# Patient Record
Sex: Male | Born: 1980 | Race: White | Hispanic: Yes | Marital: Single | Smoking: Never smoker
Health system: Southern US, Community
[De-identification: ages and names within clinical notes are randomized; demographics above are authoritative.]

---

## 2004-03-18 ENCOUNTER — Ambulatory Visit (HOSPITAL_COMMUNITY): Admission: RE | Admit: 2004-03-18 | Discharge: 2004-03-18 | Payer: Self-pay | Admitting: Chiropractor

## 2008-10-08 ENCOUNTER — Emergency Department (HOSPITAL_COMMUNITY): Admission: EM | Admit: 2008-10-08 | Discharge: 2008-10-08 | Payer: Self-pay | Admitting: Emergency Medicine

## 2010-05-21 LAB — COMPREHENSIVE METABOLIC PANEL
AST: 35 U/L (ref 0–37)
Albumin: 4.5 g/dL (ref 3.5–5.2)
Alkaline Phosphatase: 98 U/L (ref 39–117)
Calcium: 9.7 mg/dL (ref 8.4–10.5)
Chloride: 102 mEq/L (ref 96–112)
Creatinine, Ser: 0.88 mg/dL (ref 0.4–1.5)
Glucose, Bld: 135 mg/dL — ABNORMAL HIGH (ref 70–99)
Potassium: 4.1 mEq/L (ref 3.5–5.1)
Total Protein: 8.1 g/dL (ref 6.0–8.3)

## 2010-05-21 LAB — POCT I-STAT, CHEM 8
BUN: 12 mg/dL (ref 6–23)
Calcium, Ion: 1.07 mmol/L — ABNORMAL LOW (ref 1.12–1.32)
Chloride: 103 mEq/L (ref 96–112)
Creatinine, Ser: 0.9 mg/dL (ref 0.4–1.5)
Glucose, Bld: 136 mg/dL — ABNORMAL HIGH (ref 70–99)
HCT: 48 % (ref 39.0–52.0)
Potassium: 4 mEq/L (ref 3.5–5.1)
Sodium: 139 mEq/L (ref 135–145)

## 2010-05-21 LAB — POCT CARDIAC MARKERS
CKMB, poc: 1.2 ng/mL (ref 1.0–8.0)
Troponin i, poc: <0.05 ng/mL (ref 0.00–0.09)

## 2011-01-21 IMAGING — CT CT PELVIS W/ CM
2 of 4 series · 17 of 46 positions shown, 19 images · IV contrast (APPLIED)
Comparison: Abdomen ultrasound obtained earlier today.

CT ABDOMEN

CLINICAL DATA: Diffuse abdominal pain.  Known cholelithiasis.

CT ABDOMEN AND PELVIS WITH CONTRAST
TECHNIQUE: Multidetector CT imaging of the abdomen and pelvis was
performed using the standard protocol following bolus
administration of intravenous contrast.
Contrast: 100 ml Lmnipaque-R33

[Series 3: abd/pelv with 5.0 b31f st · axial · 0.87mm/px · z∈[-519,-49]mm · 14 of 104 slices shown, 16 images]
[im 5/104  soft-tissue]
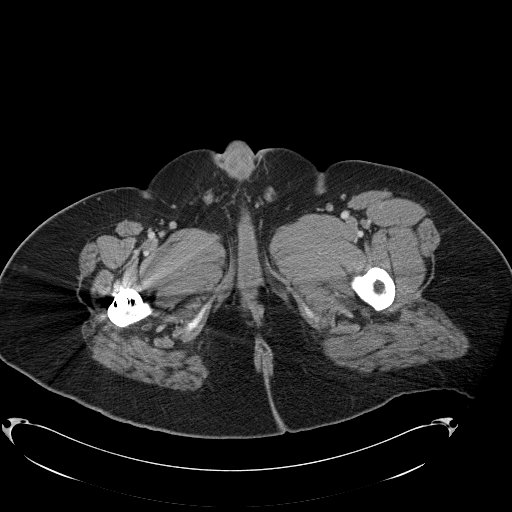
[im 5/104  bone]
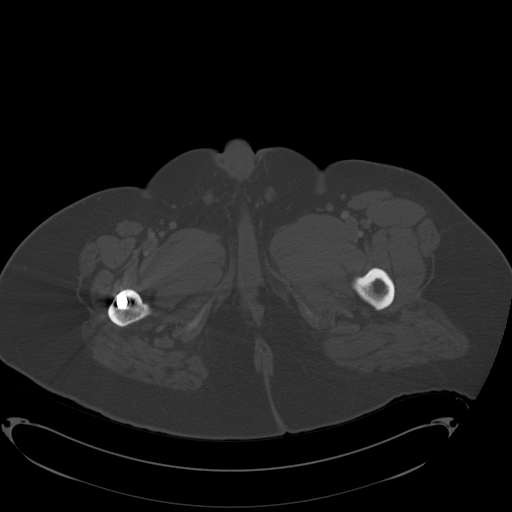
[im 13/104  soft-tissue]
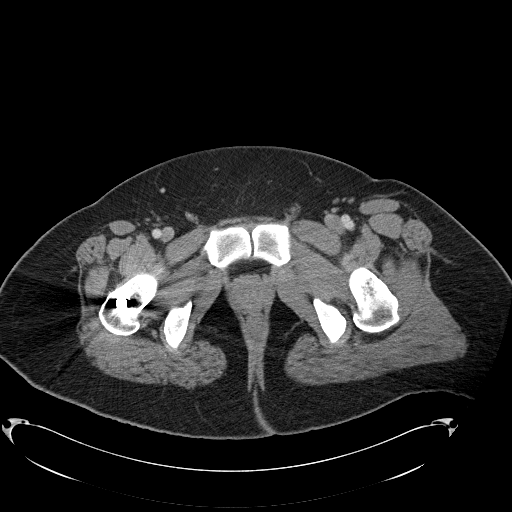
[im 21/104  soft-tissue]
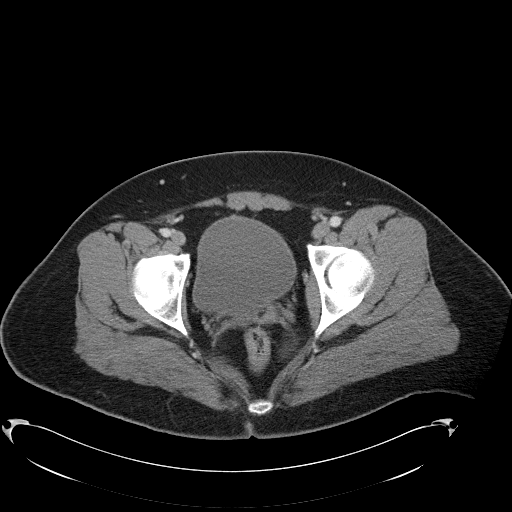
[im 29/104  soft-tissue]
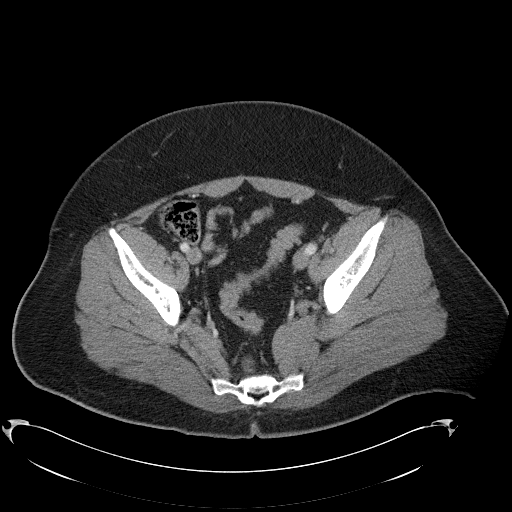
[im 33/104  soft-tissue]
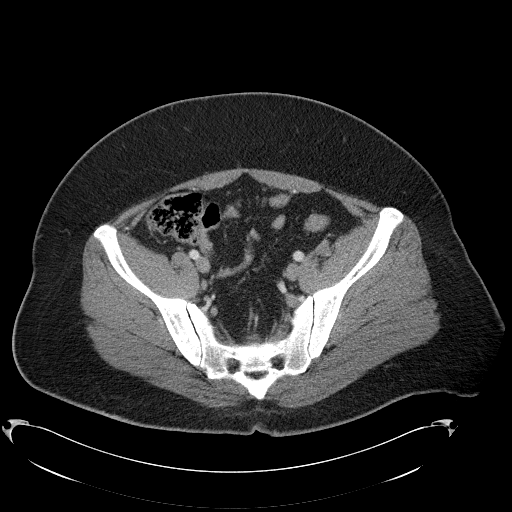
[im 42/104  soft-tissue]
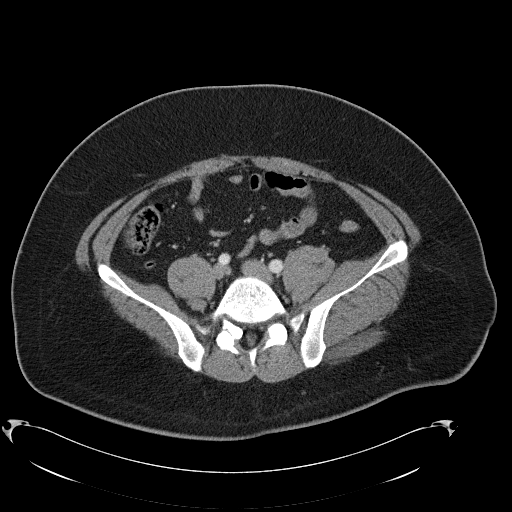
[im 50/104  soft-tissue]
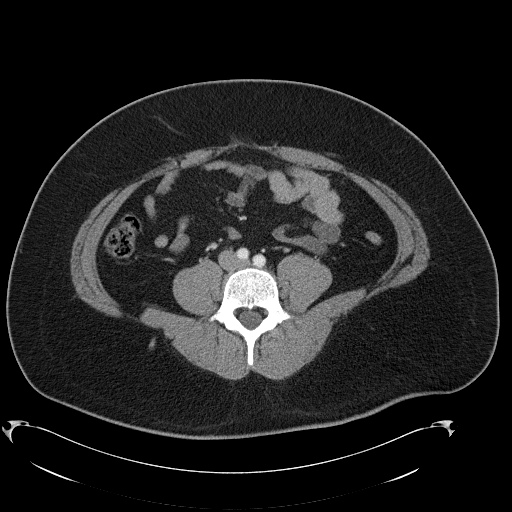
[im 54/104  soft-tissue]
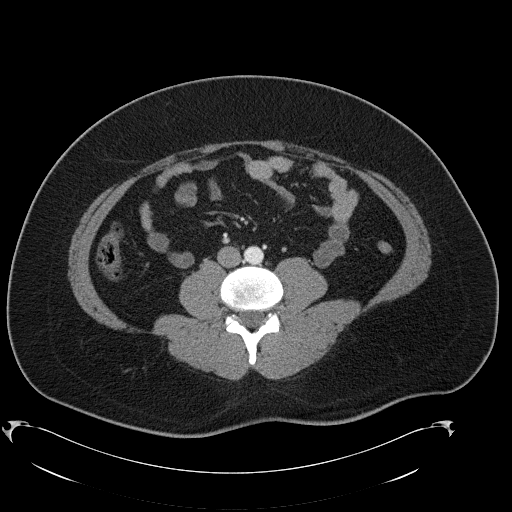
[im 62/104  soft-tissue]
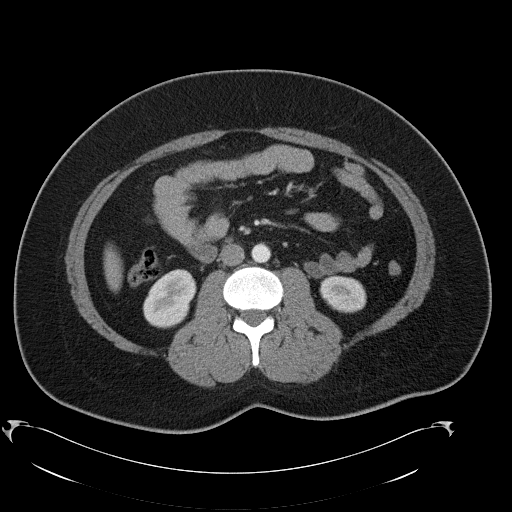
[im 62/104  bone]
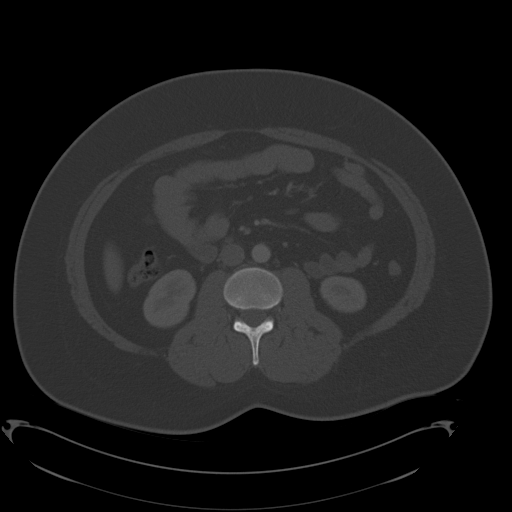
[im 71/104  soft-tissue]
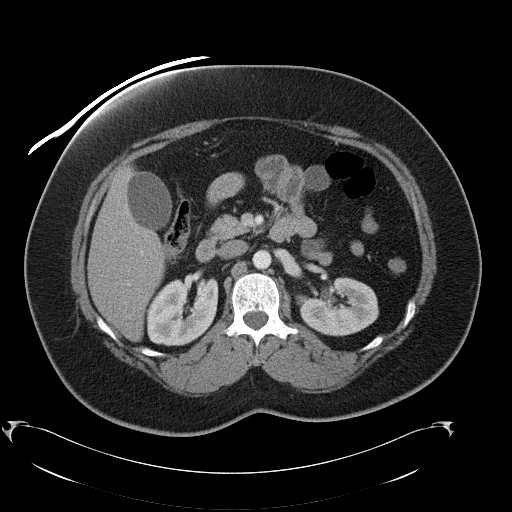
[im 79/104  soft-tissue]
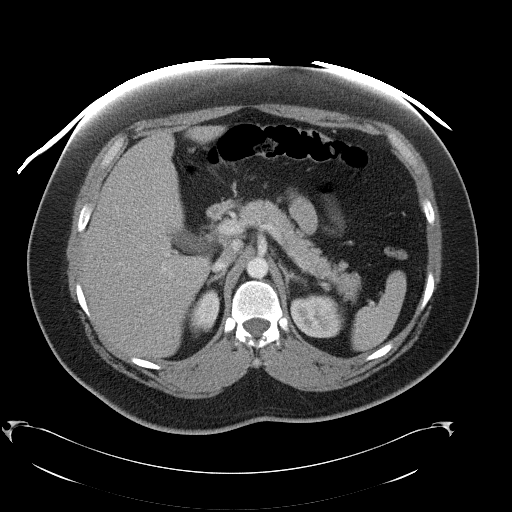
[im 83/104  soft-tissue]
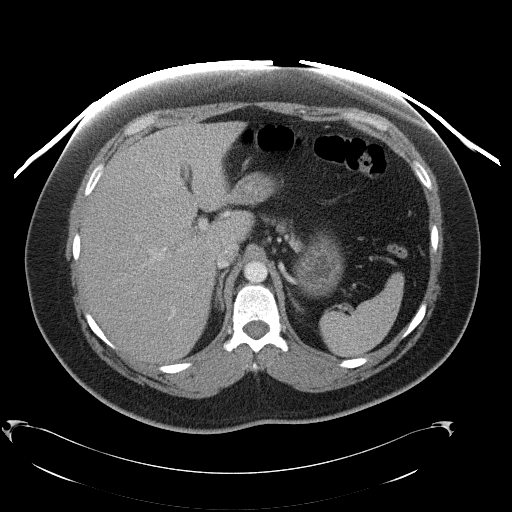
[im 91/104  soft-tissue]
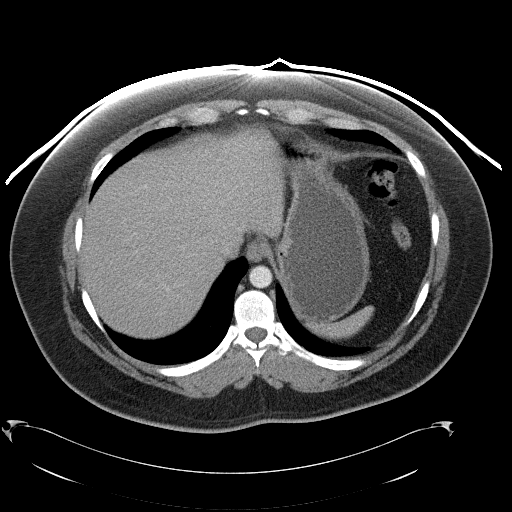
[im 99/104  soft-tissue]
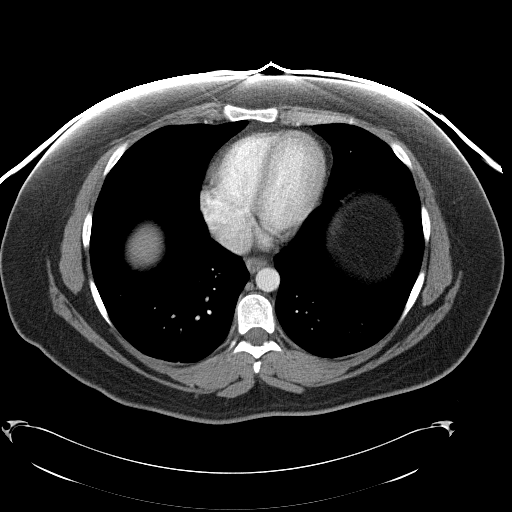

[Series 5: abd/pelv with 3.0 spo cor st · coronal · 1.01mm/px · 3 of 75 slices shown]
[im 25/75  soft-tissue]
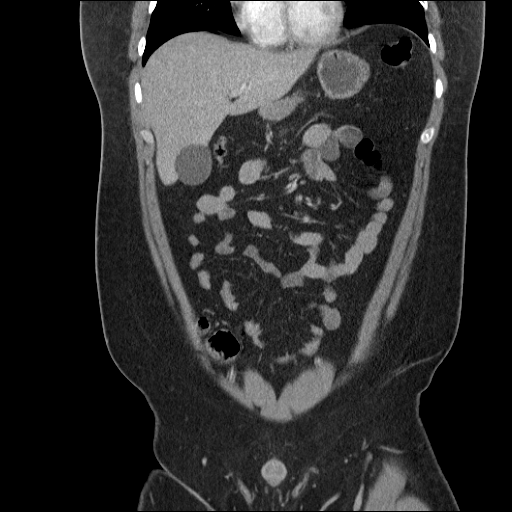
[im 33/75  soft-tissue]
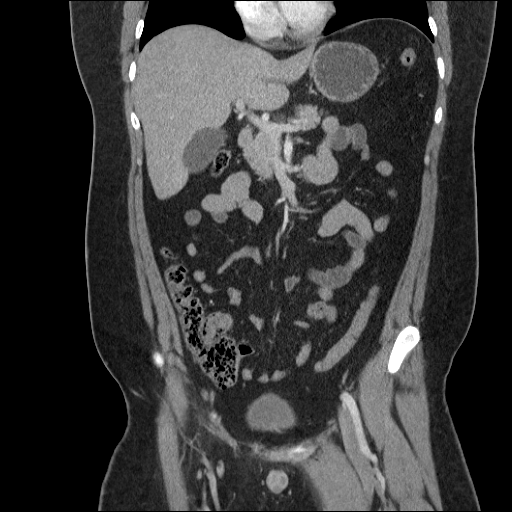
[im 42/75  soft-tissue]
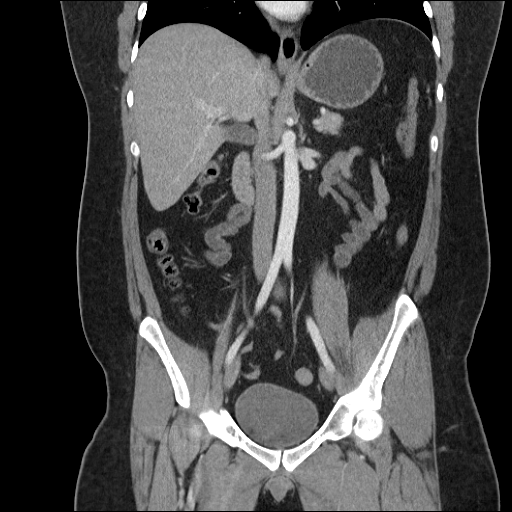

[17 of 46 positions shown; findings below may reference images not displayed]

FINDINGS: Normal sized heart.  Clear lung bases.  Normal appearing
liver, spleen, pancreas, gallbladder and adrenal glands.  Multiple
small punctate areas of increased density in both kidneys.  It is
difficult to determine if this is early excreted contrast or
calculi.  No gastrointestinal abnormalities or enlarged lymph
nodes.  Probable old fracture with a pseudoarticulation involving
the right inferior L5 facet with associated bony hypertrophy.  This
does not have the typical appearance of a pars defect.
IMPRESSION: 1.  Early excreted contrast or small calculi in both kidneys.
2.  Probable old right L5 inferior facet fracture with a
pseudoarticulation and secondary bony hypertrophy.
3.  No acute abnormality.

CT PELVIS
FINDINGS: Normal appearing appendix in the upper right pelvis.
Unremarkable urinary bladder, prostate gland and bowel loops.  No
free peritoneal fluid or enlarged lymph nodes.  Right femur
intramedullary rod.
IMPRESSION: No acute abnormality.

## 2012-06-18 ENCOUNTER — Other Ambulatory Visit: Payer: Self-pay | Admitting: Occupational Medicine

## 2012-06-18 ENCOUNTER — Ambulatory Visit: Payer: Self-pay

## 2012-06-18 DIAGNOSIS — Z021 Encounter for pre-employment examination: Secondary | ICD-10-CM

## 2014-10-01 IMAGING — CR DG CHEST 1V
1 series · 1 of 1 positions shown · non-contrast
Comparison: 10/08/2008

CLINICAL DATA: Pre employment physical exam

CHEST - 1 VIEW

[view not recorded]
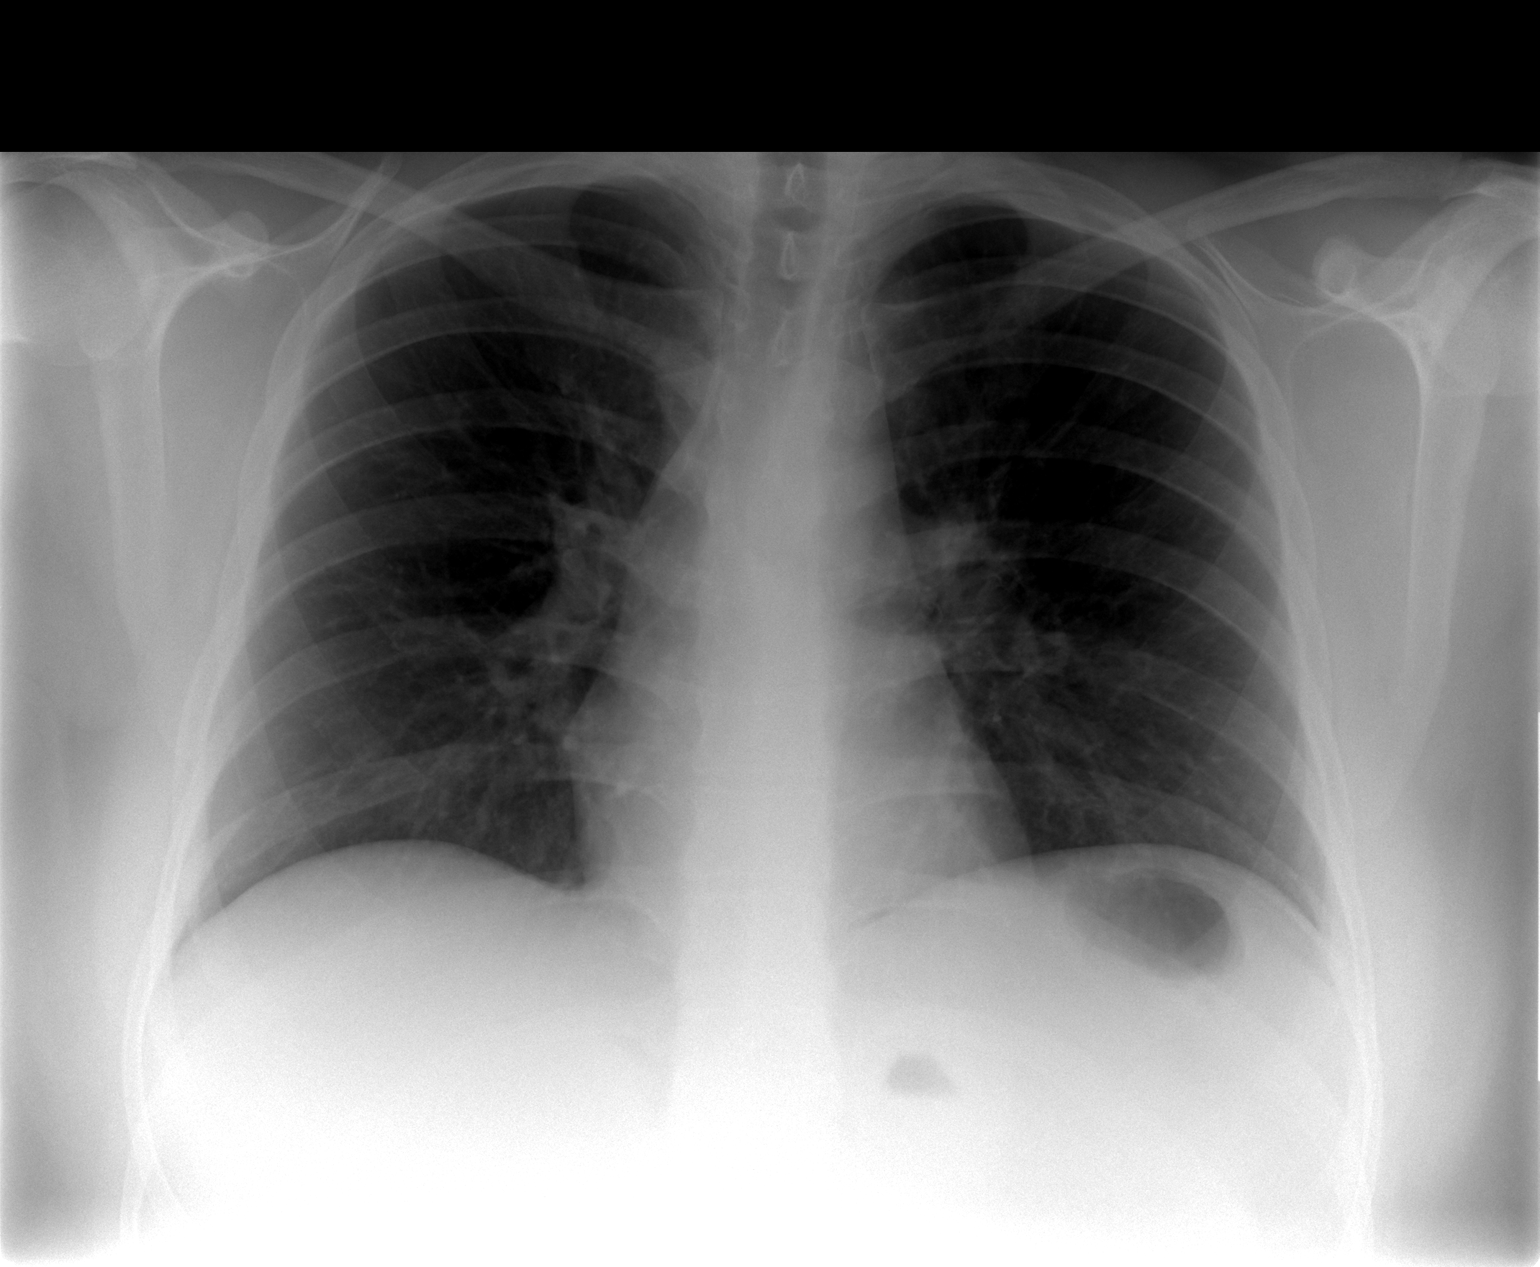

[1 of 1 positions shown; findings below may reference images not displayed]

FINDINGS: Cardiomediastinal silhouette is stable.  No acute
infiltrate or pleural effusion.  No pulmonary edema.
IMPRESSION: No active disease.  No significant change.

## 2015-04-26 ENCOUNTER — Ambulatory Visit (INDEPENDENT_AMBULATORY_CARE_PROVIDER_SITE_OTHER): Payer: BLUE CROSS/BLUE SHIELD | Admitting: Physician Assistant

## 2015-04-26 VITALS — BP 122/82 | HR 90 | Temp 98.5°F | Resp 16 | Ht 68.5 in | Wt 247.0 lb

## 2015-04-26 DIAGNOSIS — R69 Illness, unspecified: Principal | ICD-10-CM

## 2015-04-26 DIAGNOSIS — J111 Influenza due to unidentified influenza virus with other respiratory manifestations: Secondary | ICD-10-CM | POA: Diagnosis not present

## 2015-04-26 NOTE — Patient Instructions (Signed)
     IF you received an x-ray today, you will receive an invoice from [facility]. Please contact [facility] at [phone] with questions or concerns regarding your invoice.   IF you received labwork today, you will receive an invoice from [facility]. Please contact [facility] at [phone] with questions or concerns regarding your invoice.   Our billing staff will not be able to assist you with questions regarding bills from these companies.  You will be contacted with the lab results as soon as they are available. The fastest way to get your results is to activate your My Chart account. Instructions are located on the last page of this paperwork. If you have not heard from us regarding the results in 2 weeks, please contact this office.      

## 2015-04-26 NOTE — Progress Notes (Signed)
   04/26/2015 6:49 PM   DOB: Jul 25, 1980 / MRN: [id number]  SUBJECTIVE:  Jeff Williams is a 35 y.o. male presenting for for the evaluation of cough, sore throat, headache and myalgia and chills that started 4 days ago and has resolved. He states he needs a work note to excuse his absence. Treatments tried thus far include OTC preps with fair to good relief. He reports sick contacts.  He has No Known Allergies.   He  has no past medical history on file.    He  reports that he has never smoked. He does not have any smokeless tobacco history on file. He  has no sexual activity history on file. The patient  has no past surgical history on file.  His family history is not on file.  Review of Systems  Constitutional: Negative for fever.  Respiratory: Negative for cough.   Cardiovascular: Negative for chest pain.  Gastrointestinal: Negative for nausea.  Musculoskeletal: Negative for myalgias.  Skin: Negative for rash.  Neurological: Negative for dizziness and headaches.    Problem list and medications reviewed and updated by myself where necessary, and exist elsewhere in the encounter.   OBJECTIVE:  BP 122/82 mmHg  Pulse 90  Temp(Src) 98.5 F (36.9 C) (Oral)  Resp 16  Ht 5' 8.5" (1.74 m)  Wt 247 lb (112.038 kg)  BMI 37.01 kg/m2  SpO2 98%  Physical Exam  Constitutional: He is oriented to person, place, and time. He appears well-nourished. No distress.  HENT:  Right Ear: Hearing and tympanic membrane normal.  Left Ear: Hearing and tympanic membrane normal.  Nose: Mucosal edema present. No sinus tenderness. Right sinus exhibits no maxillary sinus tenderness and no frontal sinus tenderness. Left sinus exhibits no maxillary sinus tenderness and no frontal sinus tenderness.  Mouth/Throat: Uvula is midline, oropharynx is clear and moist and mucous membranes are normal.  Eyes: EOM are normal. Pupils are equal, round, and reactive to light.  Cardiovascular: Normal rate.     Pulmonary/Chest: Effort normal.  Abdominal: He exhibits no distension.  Neurological: He is alert and oriented to person, place, and time. No cranial nerve deficit. Skin: Skin is dry. He is not diaphoretic.  Vitals reviewed.  No results found for this or any previous visit (from the past 48 hour(s)).  ASSESSMENT AND PLAN:  Jeff Williams was seen today for cough and nasal congestion.  Diagnoses and all orders for this visit:  Influenza-like illness: It appears that he had the flu and this has resolved.  He has no documented health comorbidities. Work note provided excusing him from missed time.     The patient was advised to call or return to clinic if he does not see an improvement in symptoms or to seek the care of the closest emergency department if he worsens with the above plan.   Deliah BostonMichael Clark, MHS, PA-C [facility] 04/26/2015 6:49 PM

## 2015-12-24 ENCOUNTER — Ambulatory Visit (INDEPENDENT_AMBULATORY_CARE_PROVIDER_SITE_OTHER): Payer: BLUE CROSS/BLUE SHIELD | Admitting: Urgent Care

## 2015-12-24 VITALS — BP 126/84 | HR 88 | Temp 97.7°F | Resp 16 | Ht 68.5 in | Wt 244.0 lb

## 2015-12-24 DIAGNOSIS — J069 Acute upper respiratory infection, unspecified: Secondary | ICD-10-CM

## 2015-12-24 DIAGNOSIS — R0981 Nasal congestion: Secondary | ICD-10-CM | POA: Diagnosis not present

## 2015-12-24 DIAGNOSIS — B9789 Other viral agents as the cause of diseases classified elsewhere: Secondary | ICD-10-CM | POA: Diagnosis not present

## 2015-12-24 MED ORDER — BENZONATATE 100 MG PO CAPS: 100.0000 mg | ORAL_CAPSULE | Freq: Three times a day (TID) | ORAL | 0 refills | Status: DC | PRN

## 2015-12-24 MED ORDER — PSEUDOEPHEDRINE HCL ER 120 MG PO TB12: 120.0000 mg | ORAL_TABLET | Freq: Two times a day (BID) | ORAL | 3 refills | Status: DC

## 2015-12-24 MED ORDER — HYDROCODONE-HOMATROPINE 5-1.5 MG/5ML PO SYRP: 5.0000 mL | ORAL_SOLUTION | Freq: Every evening | ORAL | 0 refills | Status: DC | PRN

## 2015-12-24 NOTE — Patient Instructions (Addendum)
Upper Respiratory Infection, Adult Most upper respiratory infections (URIs) are a viral infection of the air passages leading to the lungs. A URI affects the nose, throat, and upper air passages. The most common type of URI is nasopharyngitis and is typically referred to as "the common cold." URIs run their course and usually go away on their own. Most of the time, a URI does not require medical attention, but sometimes a bacterial infection in the upper airways can follow a viral infection. This is called a secondary infection. Sinus and middle ear infections are common types of secondary upper respiratory infections. Bacterial pneumonia can also complicate a URI. A URI can worsen asthma and chronic obstructive pulmonary disease (COPD). Sometimes, these complications can require emergency medical care and may be life threatening.  CAUSES Almost all URIs are caused by viruses. A virus is a type of germ and can spread from one person to another.  RISKS FACTORS You may be at risk for a URI if:   You smoke.   You have chronic heart or lung disease.  You have a weakened defense (immune) system.   You are very young or very old.   You have nasal allergies or asthma.  You work in crowded or poorly ventilated areas.  You work in health care facilities or schools. SIGNS AND SYMPTOMS  Symptoms typically develop 2-3 days after you come in contact with a cold virus. Most viral URIs last 7-10 days. However, viral URIs from the influenza virus (flu virus) can last 14-18 days and are typically more severe. Symptoms may include:   Runny or stuffy (congested) nose.   Sneezing.   Cough.   Sore throat.   Headache.   Fatigue.   Fever.   Loss of appetite.   Pain in your forehead, behind your eyes, and over your cheekbones (sinus pain).  Muscle aches.  DIAGNOSIS  Your health care provider may diagnose a URI by:  Physical exam.  Tests to check that your symptoms are not due to  another condition such as:  Strep throat.  Sinusitis.  Pneumonia.  Asthma. TREATMENT  A URI goes away on its own with time. It cannot be cured with medicines, but medicines may be prescribed or recommended to relieve symptoms. Medicines may help:  Reduce your fever.  Reduce your cough.  Relieve nasal congestion. HOME CARE INSTRUCTIONS   Take medicines only as directed by your health care provider.   Gargle warm saltwater or take cough drops to comfort your throat as directed by your health care provider.  Use a warm mist humidifier or inhale steam from a shower to increase air moisture. This may make it easier to breathe.  Drink enough fluid to keep your urine clear or pale yellow.   Eat soups and other clear broths and maintain good nutrition.   Rest as needed.   Return to work when your temperature has returned to normal or as your health care provider advises. You may need to stay home longer to avoid infecting others. You can also use a face mask and careful hand washing to prevent spread of the virus.  Increase the usage of your inhaler if you have asthma.   Do not use any tobacco products, including cigarettes, chewing tobacco, or electronic cigarettes. If you need help quitting, ask your health care provider. PREVENTION  The best way to protect yourself from getting a cold is to practice good hygiene.   Avoid oral or hand contact with people with cold   symptoms.   Wash your hands often if contact occurs.  There is no clear evidence that vitamin C, vitamin E, echinacea, or exercise reduces the chance of developing a cold. However, it is always recommended to get plenty of rest, exercise, and practice good nutrition.  SEEK MEDICAL CARE IF:   You are getting worse rather than better.   Your symptoms are not controlled by medicine.   You have chills.  You have worsening shortness of breath.  You have brown or red mucus.  You have yellow or brown nasal  discharge.  You have pain in your face, especially when you bend forward.  You have a fever.  You have swollen neck glands.  You have pain while swallowing.  You have white areas in the back of your throat. SEEK IMMEDIATE MEDICAL CARE IF:   You have severe or persistent:  Headache.  Ear pain.  Sinus pain.  Chest pain.  You have chronic lung disease and any of the following:  Wheezing.  Prolonged cough.  Coughing up blood.  A change in your usual mucus.  You have a stiff neck.  You have changes in your:  Vision.  Hearing.  Thinking.  Mood. MAKE SURE YOU:   Understand these instructions.  Will watch your condition.  Will get help right away if you are not doing well or get worse.   This information is not intended to replace advice given to you by your health care provider. Make sure you discuss any questions you have with your health care provider.   Document Released: 07/26/2000 Document Revised: 06/16/2014 Document Reviewed: 05/07/2013 Elsevier Interactive Patient Education 2016 [employer]     IF you received an x-ray today, you will receive an invoice from [facility]. Please contact [facility] at [phone] with questions or concerns regarding your invoice.   IF you received labwork today, you will receive an invoice from [facility]. Please contact [facility] at [phone] with questions or concerns regarding your invoice.   Our billing staff will not be able to assist you with questions regarding bills from these companies.  You will be contacted with the lab results as soon as they are available. The fastest way to get your results is to activate your My Chart account. Instructions are located on the last page of this paperwork. If you have not heard from us regarding the results in 2 weeks, please contact this office.     

## 2015-12-24 NOTE — Progress Notes (Signed)
    MRN: [id number] DOB: 11-08-1980  Subjective:   Jeff Williams is a 35 y.o. male presenting for chief complaint of Cough (getting worse since yesterday) and Nasal Congestion  Reports 1 week history of productive cough, sinus congestion, scratchy throat, difficulty swallowing. Has had a couple of coughing fits that elicit shob. Has not tried any medications for relief. Denies fever, sinus pain, ear pain, ear drainage, sore throat, chest pain, n/v, abdominal pain, rashes, body aches, fatigue. Has been working 13 days straight, not resting. Denies history of seasonal allergies, asthma. Denies smoking cigarettes. Has not had flu shot this season.  Jeff Williams is not currently taking any medications. Also has No Known Allergies.  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   Objective:   Vitals: BP 126/84 (BP Location: Right Arm, Patient Position: Sitting, Cuff Size: Large)   Pulse 88   Temp 97.7 F (36.5 C)   Resp 16   Ht 5' 8.5" (1.74 m)   Wt 244 lb (110.7 kg)   SpO2 100%   BMI 36.56 kg/m   Physical Exam  Constitutional: He is oriented to person, place, and time. He appears well-developed and well-nourished.  HENT:  TM's intact bilaterally but no effusions or erythema. Nasal turbinates edematous with thick clear mucus but without sinus tenderness. Mild postnasal drip present but without oropharyngeal exudates, erythema or abscesses.  Eyes: Right eye exhibits no discharge. Left eye exhibits no discharge. No scleral icterus.  Neck: Normal range of motion. Neck supple.  Cardiovascular: Normal rate, regular rhythm and intact distal pulses.  Exam reveals no gallop and no friction rub.   No murmur heard. Pulmonary/Chest: No respiratory distress. He has no wheezes. He has no rales.  Lymphadenopathy:    He has cervical adenopathy (upper, anterior).  Neurological: He is alert and oriented to person,  place, and time.  Skin: Skin is warm and dry.   Assessment and Plan :   1. Viral URI with cough 2. Nasal congestion - Recommended supportive care for what is most likely viral sinusitis with cough. - If no improvement or symptoms do not resolve return to clinic in 3 days.   Wallis BambergMario Cing Pastoria, PA-C [facility] [phone] 12/24/2015 1:18 PM

## 2017-10-24 ENCOUNTER — Encounter (HOSPITAL_COMMUNITY): Payer: Self-pay

## 2017-10-24 ENCOUNTER — Other Ambulatory Visit: Payer: Self-pay

## 2017-10-24 ENCOUNTER — Ambulatory Visit (HOSPITAL_COMMUNITY)
Admission: EM | Admit: 2017-10-24 | Discharge: 2017-10-24 | Disposition: A | Discharge status: Home / Self Care | Payer: BLUE CROSS/BLUE SHIELD | Attending: Family Medicine | Admitting: Family Medicine

## 2017-10-24 DIAGNOSIS — R05 Cough: Secondary | ICD-10-CM | POA: Diagnosis not present

## 2017-10-24 DIAGNOSIS — R053 Chronic cough: Secondary | ICD-10-CM

## 2017-10-24 MED ORDER — BENZONATATE 100 MG PO CAPS: 100.0000 mg | ORAL_CAPSULE | Freq: Three times a day (TID) | ORAL | 0 refills | Status: AC

## 2017-10-24 NOTE — ED Provider Notes (Signed)
[facility]   [id number] 10/24/17 Arrival Time: 1747  CC: Cough  SUBJECTIVE:  Jeff Williams is a 37 y.o. male who presents with stable intermittent cough x 6 weeks.  Denies positive sick exposure or precipitating event. Denies recent travel. Describes cough as intermittent and dry.  Has not tried OTC medications.  Denies aggravating factors.  Reports previous symptoms in the past that usually resolve without intervention.  Denies fever, chills, fatigue, sinus pain, rhinorrhea, sore throat, acid reflux, SOB, wheezing, chest pain, nausea, changes in bowel or bladder habits.    ROS: As per HPI.  History reviewed. No pertinent past medical history. History reviewed. No pertinent surgical history. No Known Allergies No current facility-administered medications on file prior to encounter.    No current outpatient medications on file prior to encounter.    Social History   Socioeconomic History  . Marital status: Single    Spouse name: Not on file  . Number of children: Not on file  . Years of education: Not on file  . Highest education level: Not on file  Occupational History  . Not on file  Social Needs  . Financial resource strain: Not on file  . Food insecurity:    Worry: Not on file    Inability: Not on file  . Transportation needs:    Medical: Not on file    Non-medical: Not on file  Tobacco Use  . Smoking status: Never Smoker  . Smokeless tobacco: Never Used  Substance and Sexual Activity  . Alcohol use: Not on file  . Drug use: Not on file  . Sexual activity: Not on file  Lifestyle  . Physical activity:    Days per week: Not on file    Minutes per session: Not on file  . Stress: Not on file  Relationships  . Social connections:    Talks on phone: Not on file    Gets together: Not on file    Attends religious service: Not on file    Active member of club or organization: Not on file    Attends meetings of clubs or organizations: Not on file   Relationship status: Not on file  . Intimate partner violence:    Fear of current or ex partner: Not on file    Emotionally abused: Not on file    Physically abused: Not on file    Forced sexual activity: Not on file  Other Topics Concern  . Not on file  Social History Narrative  . Not on file   History reviewed. No pertinent family history.   OBJECTIVE:  Vitals:   10/24/17 1834 10/24/17 1835  BP: 130/79   Pulse: 95   Resp: 18   Temp: 98.8 F (37.1 C)   SpO2: 99%   Weight:  283 lb (128.4 kg)     General appearance: AOx3 in no acute distress; nontoxic appearance HEENT: PERRL.  EOM grossly intact.  Sinuses nontender; no clear rhinorrhea; tonsils nonerythematous, uvula midline Neck: supple without LAD Lungs: clear to auscultation bilaterally without adventitious breath sounds; mild dry cough throughout encounter Heart: regular rate and rhythm.  Radial pulses 2+ symmetrical bilaterally Skin: warm and dry Psychological: alert and cooperative; normal mood and affect  ASSESSMENT & PLAN:  1. Chronic cough     Meds ordered this encounter  Medications  . benzonatate (TESSALON) 100 MG capsule    Sig: Take 1 capsule (100 mg total) by mouth every 8 (eight) hours.    Dispense:  21  capsule    Refill:  0    Order Specific Question:   Supervising Provider    Answer:   Isa Rankin [[id number]]   Chronic cough may be related to acid reflux or allergies Trial OTC antihistamine as needed for symptomatic relief Tessalon perles prescribed Drink plenty of fluids and get rest Follow up with PCP if symptoms persists Return or go to the ER if you have any new or worsening symptoms   Reviewed expectations re: course of current medical issues. Questions answered. Outlined signs and symptoms indicating need for more acute intervention. Patient verbalized understanding. After Visit Summary given.          Rennis Harding, PA-C 10/24/17 2034

## 2017-10-24 NOTE — Discharge Instructions (Addendum)
Chronic cough may be related to acid reflux or allergies Trial OTC antihistamine as needed for symptomatic relief Tessalon perles prescribed Drink plenty of fluids and get rest Follow up with PCP if symptoms persists Return or go to the ER if you have any new or worsening symptoms

## 2017-10-24 NOTE — ED Triage Notes (Signed)
Pt states he has a cough x 6 weeks
